# Patient Record
Sex: Female | Born: 1990 | Race: White | Marital: Single | Smoking: Never smoker
Health system: Southern US, Community
[De-identification: ages and names within clinical notes are randomized; demographics above are authoritative.]

## PROBLEM LIST (undated history)

## (undated) DIAGNOSIS — F419 Anxiety disorder, unspecified: Secondary | ICD-10-CM

## (undated) DIAGNOSIS — F32A Depression, unspecified: Secondary | ICD-10-CM

## (undated) DIAGNOSIS — G43909 Migraine, unspecified, not intractable, without status migrainosus: Secondary | ICD-10-CM

## (undated) DIAGNOSIS — F329 Major depressive disorder, single episode, unspecified: Secondary | ICD-10-CM

## (undated) HISTORY — DX: Depression, unspecified: F32.A

## (undated) HISTORY — DX: Major depressive disorder, single episode, unspecified: F32.9

## (undated) HISTORY — DX: Anxiety disorder, unspecified: F41.9

## (undated) HISTORY — DX: Migraine, unspecified, not intractable, without status migrainosus: G43.909

---
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
## 2015-12-20 ENCOUNTER — Other Ambulatory Visit: Payer: Self-pay | Admitting: Family Medicine

## 2015-12-20 DIAGNOSIS — R4020 Unspecified coma: Secondary | ICD-10-CM

## 2015-12-26 ENCOUNTER — Ambulatory Visit
Admission: RE | Admit: 2015-12-26 | Discharge: 2015-12-26 | Disposition: A | Discharge status: Home / Self Care | Payer: BLUE CROSS/BLUE SHIELD | Source: Ambulatory Visit | Attending: Family Medicine | Admitting: Family Medicine

## 2015-12-26 DIAGNOSIS — R4020 Unspecified coma: Secondary | ICD-10-CM

## 2016-01-04 ENCOUNTER — Other Ambulatory Visit: Payer: Self-pay

## 2016-01-08 ENCOUNTER — Ambulatory Visit (INDEPENDENT_AMBULATORY_CARE_PROVIDER_SITE_OTHER): Payer: BLUE CROSS/BLUE SHIELD | Admitting: Neurology

## 2016-01-08 ENCOUNTER — Encounter: Payer: Self-pay | Admitting: Neurology

## 2016-01-08 VITALS — BP 135/86 | HR 91 | Ht 68.0 in | Wt 151.0 lb

## 2016-01-08 DIAGNOSIS — S060X1A Concussion with loss of consciousness of 30 minutes or less, initial encounter: Secondary | ICD-10-CM

## 2016-01-08 DIAGNOSIS — S060X9A Concussion with loss of consciousness of unspecified duration, initial encounter: Secondary | ICD-10-CM | POA: Insufficient documentation

## 2016-01-08 DIAGNOSIS — R569 Unspecified convulsions: Secondary | ICD-10-CM

## 2016-01-08 DIAGNOSIS — F0781 Postconcussional syndrome: Secondary | ICD-10-CM

## 2016-01-08 NOTE — Patient Instructions (Signed)
Remember to drink plenty of fluid, eat healthy meals and do not skip any meals. Try to eat protein with a every meal and eat a healthy snack such as fruit or nuts in between meals. Try to keep a regular sleep-wake schedule and try to exercise daily, particularly in the form of walking, 20-30 minutes a day, if you can.   As far as diagnostic testing: eeg   I would like to see you back as needed, sooner if we need to. Please call us with any interim questions, concerns, problems, updates or refill requests.   Please also call us for any test results so we can go over those with you on the phone.  My clinical assistant and will answer any of your questions and relay your messages to me and also relay most of my messages to you.   Our phone number is [phone]. We also have an after hours call service for urgent matters and there is a physician on-call for urgent questions. For any emergencies you know to call 911 or go to the nearest emergency room

## 2016-01-08 NOTE — Progress Notes (Signed)
[facility]    Provider:  Dr Lucia Gaskins Referring Provider: Farris Has, MD Primary Care Physician:  No primary care provider on file.  CC:  Seizure after falling and hitting her head  HPI:  Melody Simmons is a 25 y.o. female here as a referral from Dr. Kateri Plummer for LOC. PMHx migraine, depression, anxiety, bipolar. She has had 3 episodes of hitting her head and after the third trauma to the head she had LOC for 30 minutes with a seizure, but no one called EMS. This was by report with friends who were with her at a party, they are not friends anymore. The first head trauma was in August she slipped on her flip flops and fell onto the door. She had a loss of consciousness. The second fall, was also mechanical and she did not pass out but had head wound and bleeding, she was grogggy for 2 weeks. The third was just last month. She slipped around a door frame and hit her head again. She passed out after hitting her head and 4-5 people said she was convulsing but story unclear and unreliable. No urination or defecation, she convulsed for 2 minutes and then unconscious 30 minutes however no one called EMS. She doesn't remember any of it. Since then she has been sleepy, she is exhausted even with adderall. Since then she has felt exhausted. She gets headaches, worse with working on the computer for long periods or stress. She has fogginess. She has lapses, then it is 2 hours later like when she is shopping she is in "[place]". She is always  "depressed and miserable" because she is manic and bipolar. She is being treated by psychiatry. No other focal neurologic deficits.  Reviewed notes, labs and imaging from outside physicians, which showed:  MRi of the brain(personally reviewed ikmages and agree with following) FINDINGS: There is no evidence intracranial hemorrhage on susceptibility weighted images. No acute infarct, mass, midline shift, or extra-axial fluid collection is seen. The  ventricles and sulci are normal in size. 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 The brain is otherwise unremarkable in appearance.  Orbits are unremarkable. Paranasal sinuses and mastoid air cells are clear. Major intracranial vascular flow voids are preserved.  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  Review of Systems: Patient complains of symptoms per HPI as well as the following symptoms: fatigue, eye pain, memory loss, confusion, dizziness, insomnia, snoring, restless legs. Pertinent negatives per HPI. All others negative.   Social History   Social History  . Marital Status: Single    Spouse Name: N/A  . Number of Children: 0  . Years of Education: 16   Occupational History  . Not on file.   Social History Main Topics  . Smoking status: Never Smoker   . Smokeless tobacco: Not on file  . Alcohol Use: No  . Drug Use: No  . Sexual Activity: Not on file   Other Topics Concern  . Not on file   Social History Narrative   Lives with sister, Vernona Rieger   Caffeine use: 3-5 per week    Family History  Problem Relation Age of Onset  . Glaucoma    . High blood pressure    . Stroke    . Bipolar disorder    . Depression      Past Medical History  Diagnosis Date  . Migraine   . Depression   . Anxiety  Past Surgical History  Procedure Laterality Date  . Hernia repair  1996    Current Outpatient Prescriptions  Medication Sig Dispense Refill  . ADDERALL XR 20 MG 24 hr capsule Take 20 mg by mouth every morning.  0  . ALTAVERA 0.15-30 MG-MCG tablet Take 1 tablet by mouth daily.  13  . amphetamine-dextroamphetamine (ADDERALL) 20 MG tablet Take 20 mg by mouth daily.  0  . OXcarbazepine (TRILEPTAL) 150 MG tablet Reported on 01/08/2016  5   No current facility-administered medications for this visit.    Allergies as of 01/08/2016  . (Not on File)     Vitals: BP 135/86 mmHg  Pulse 91  Ht 5\' 8"  (1.727 m)  Wt 151 lb (68.493 kg)  BMI 22.96 kg/m2 Last Weight:  Wt Readings from Last 1 Encounters:  01/08/16 151 lb (68.493 kg)   Last Height:   Ht Readings from Last 1 Encounters:  01/08/16 5\' 8"  (1.727 m)    Physical exam: Exam: Gen: NAD, conversant, well nourised, well groomed                     CV: RRR, no MRG. No Carotid Bruits. No peripheral edema, warm, nontender Eyes: Conjunctivae clear without exudates or hemorrhage  Neuro: Detailed Neurologic Exam  Speech:    Speech is normal; fluent and spontaneous with normal comprehension.  Cognition:    The patient is oriented to person, place, and time;     recent and remote memory intact;     language fluent;     normal attention, concentration,     fund of knowledge Cranial Nerves:    The pupils are equal, round, and reactive to light. The fundi are normal and spontaneous venous pulsations are present. Visual fields are full to finger confrontation. Extraocular movements are intact. Trigeminal sensation is intact and the muscles of mastication are normal. The face is symmetric. The palate elevates in the midline. Hearing intact. Voice is normal. Shoulder shrug is normal. The tongue has normal motion without fasciculations.   Coordination:    Normal finger to nose and heel to shin. Normal rapid alternating movements.   Gait:    Heel-toe and tandem gait are normal.   Motor Observation:    No asymmetry, no atrophy, and no involuntary movements noted. Tone:    Normal muscle tone.    Posture:    Posture is normal. normal erect    Strength:    Strength is V/V in the upper and lower limbs.      Sensation: intact to LT     Reflex Exam:  DTR's:    Deep tendon reflexes in the upper and lower extremities are normal bilaterally.   Toes:    The toes are downgoing bilaterally.   Clonus:    Clonus is absent.      Assessment/Plan:  25 year old with a reported seizure  in the setting of head trauma and LOC. Will perform an EEG. MRI of the brain unremarkable. No AEDs indicated at this time. Also with symptoms of post-concussive disorder. Discussed with patient at length. Rest is important in concussion recovery. Recommend shortened work days, working from home if she can, taking frequent breaks. No strenuous activity, limiting computer and reading time. Development venous anomaly is an incidental finding, discussed with patient, no follow up for that is needed. Patient is unable to drive, operate heavy machinery, perform activities at heights or participate in water activities until 6 months seizure free however I  am not convinced she actually had a seizure so this may not apply to her.    Cc; Dr. Grafton Folk, MD  [facility] [address] [place], [place] [address]  Phone [phone] Fax [phone]

## 2016-01-14 ENCOUNTER — Telehealth: Payer: Self-pay | Admitting: Neurology

## 2016-01-14 NOTE — Telephone Encounter (Signed)
Contacted pt again regarding EEG and she stated that they were speaking to another neurologist and she wasn't concerned with having the EEG at this time.  She stated that with the cost involved she didn't feel it was necessary right now.

## 2016-01-14 NOTE — Telephone Encounter (Signed)
That's fine. thanks

## 2016-12-14 ENCOUNTER — Other Ambulatory Visit: Payer: Self-pay | Admitting: Physical Medicine and Rehabilitation

## 2016-12-14 ENCOUNTER — Ambulatory Visit
Admission: RE | Admit: 2016-12-14 | Discharge: 2016-12-14 | Disposition: A | Discharge status: Home / Self Care | Payer: BLUE CROSS/BLUE SHIELD | Source: Ambulatory Visit | Attending: Physical Medicine and Rehabilitation | Admitting: Physical Medicine and Rehabilitation

## 2016-12-14 DIAGNOSIS — M549 Dorsalgia, unspecified: Secondary | ICD-10-CM

## 2016-12-14 DIAGNOSIS — M542 Cervicalgia: Secondary | ICD-10-CM

## 2017-10-31 IMAGING — CR DG CERVICAL SPINE WITH FLEX & EXTEND
8 series · 8 of 8 positions shown · non-contrast
Comparison: No recent .

CLINICAL DATA: Neck pain.  No injury .

EXAM:
CERVICAL SPINE COMPLETE WITH FLEXION AND EXTENSION VIEWS

[w cervical spine lat]
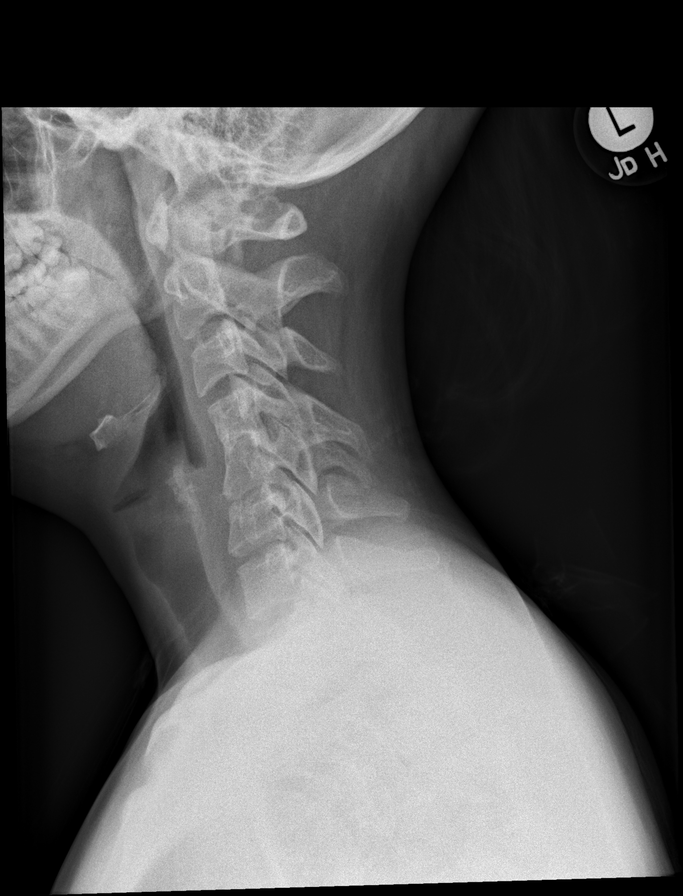

[w cervical spine flexion]
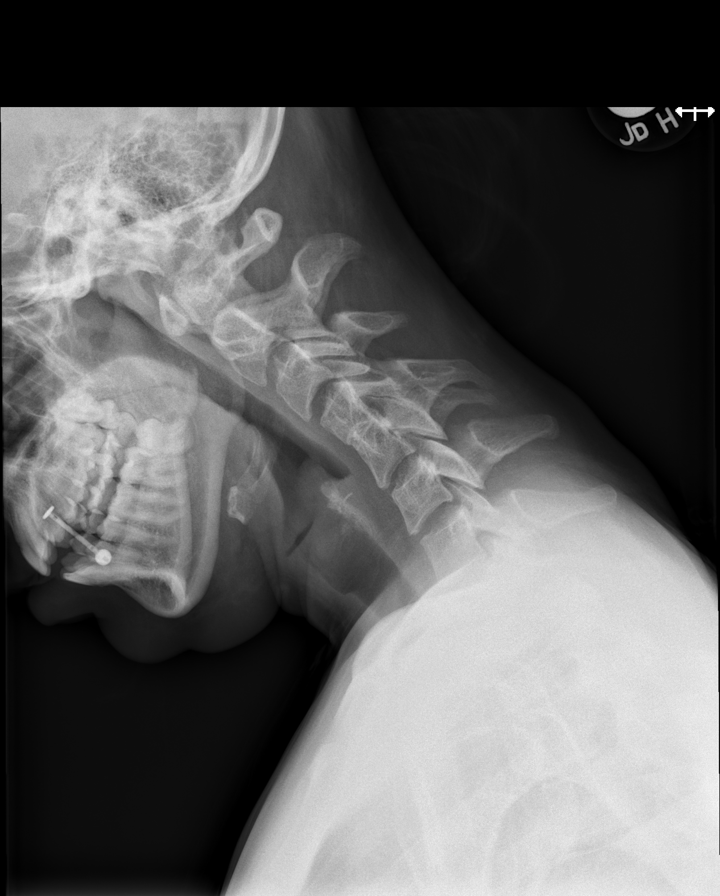

[w cervical spine extension]
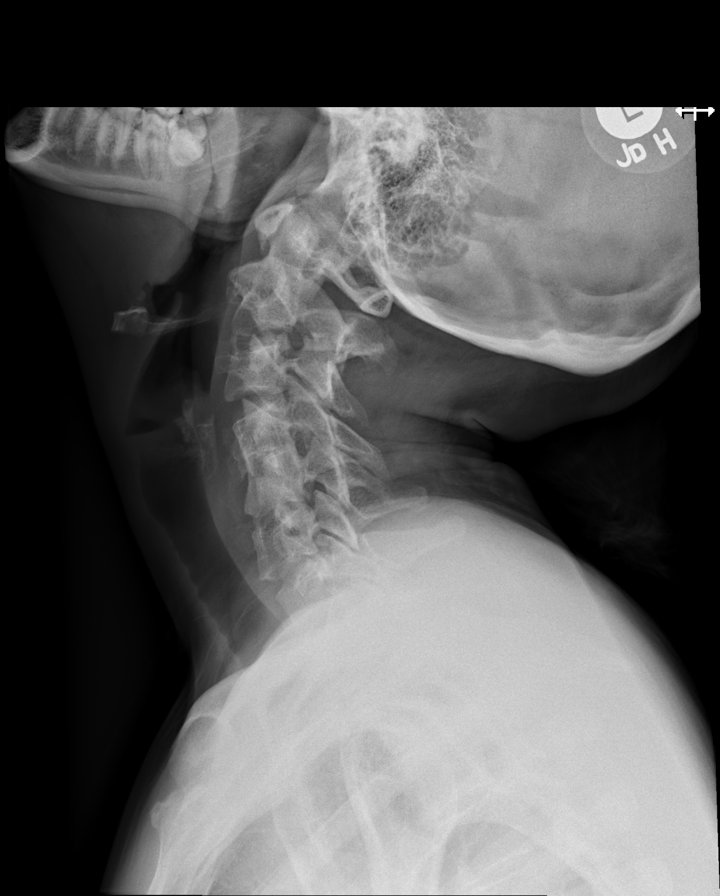

[w cervical spine ap_obl (1 of 2)]
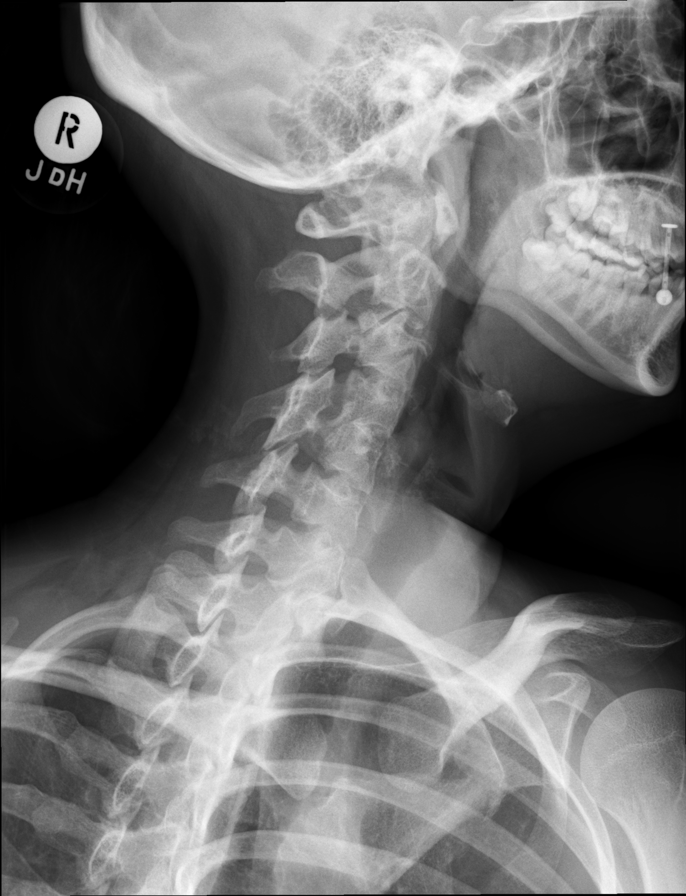

[w cervical spine ap_obl (2 of 2)]
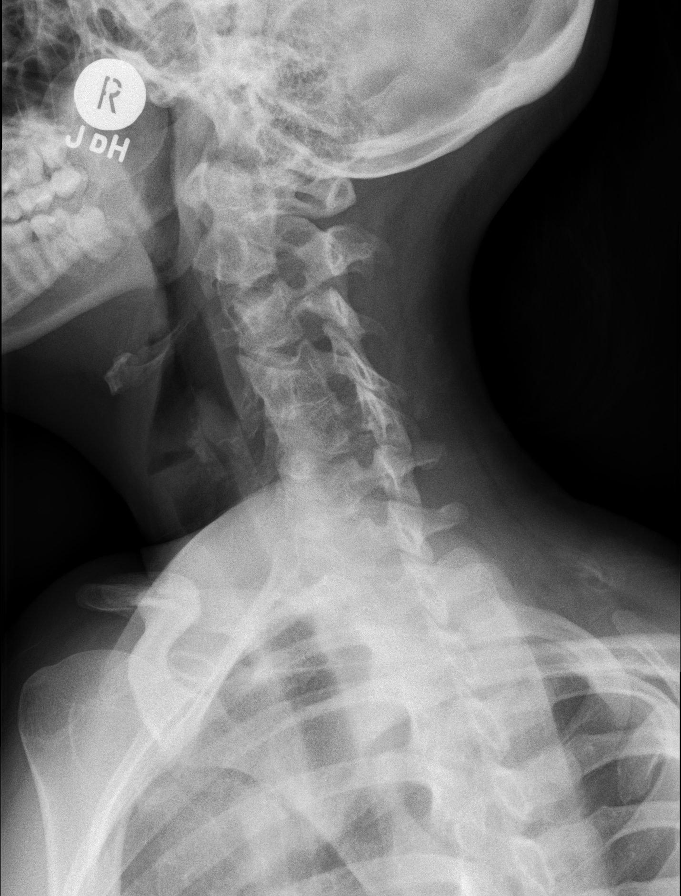

[w cervical spine ap]
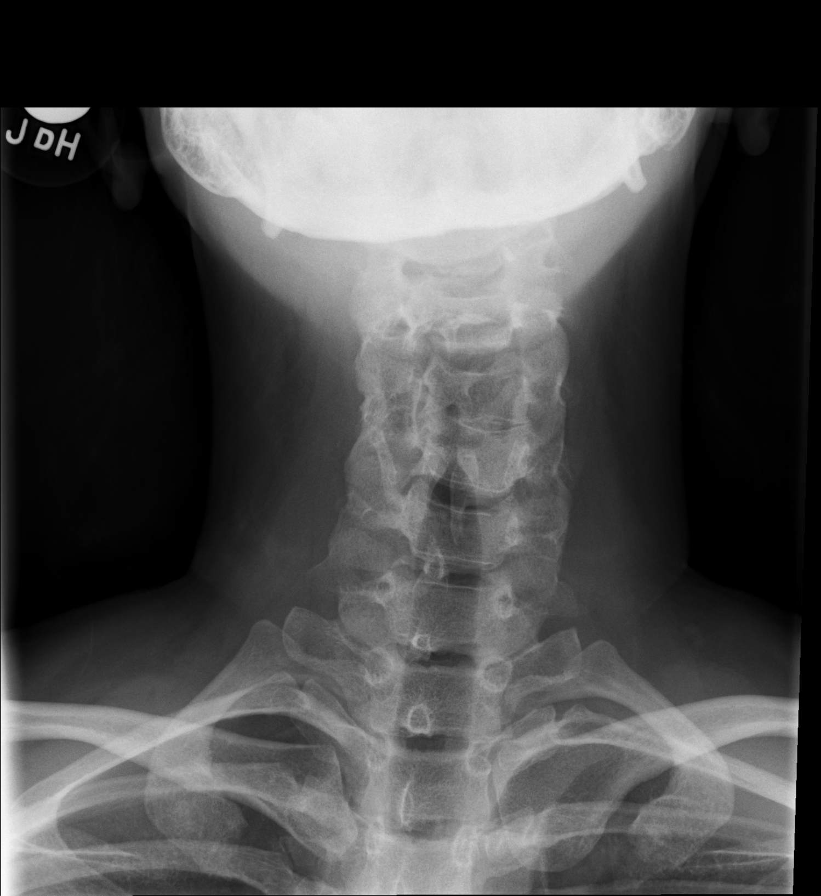

[w cervical swimmers]
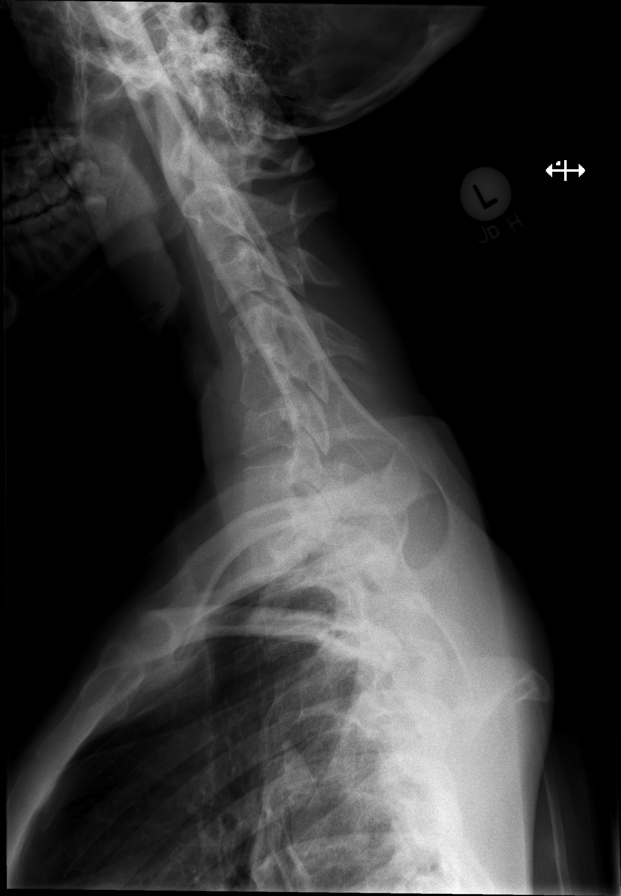

[t cervical spine odontoid]
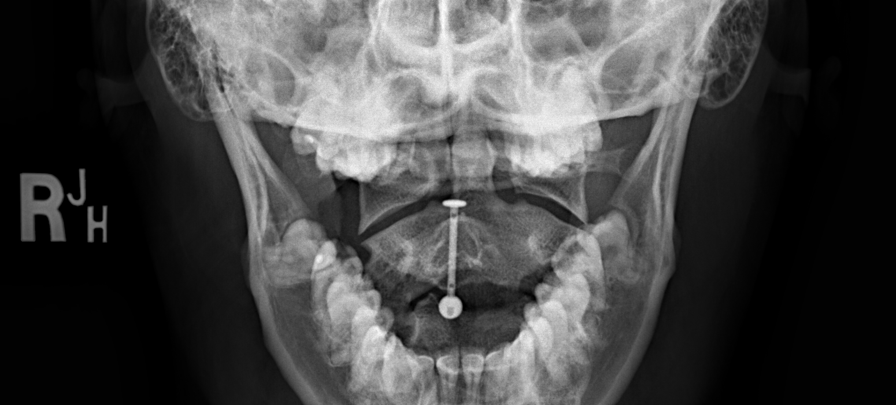

[8 of 8 positions shown; findings below may reference images not displayed]

FINDINGS: Loss of normal cervical lordosis. Degenerative change. Congenital
fusion C4-C5. No flexion-extension deformity. No acute bony
abnormality. No focal bony abnormality. Soft tissues are
unremarkable. Mild left apical pleural thickening consistent
scarring.
IMPRESSION: Loss of normal cervical lordosis. Diffuse degenerative change.
Congenital fusion C4-C5. No flexion-extension deformity. No acute
abnormality. No acute abnormality.
